# Patient Record
Sex: Female | Born: 1960 | Race: Black or African American | Hispanic: No | Marital: Single | Smoking: Current every day smoker
Health system: Southern US, Community
[De-identification: ages and names within clinical notes are randomized; demographics above are authoritative.]

## PROBLEM LIST (undated history)

## (undated) HISTORY — PX: ABDOMINAL HYSTERECTOMY: SHX81

## (undated) HISTORY — PX: HERNIA REPAIR: SHX51

---

## 2013-12-04 ENCOUNTER — Emergency Department (HOSPITAL_BASED_OUTPATIENT_CLINIC_OR_DEPARTMENT_OTHER): Payer: Self-pay

## 2013-12-04 ENCOUNTER — Emergency Department (HOSPITAL_BASED_OUTPATIENT_CLINIC_OR_DEPARTMENT_OTHER)
Admission: EM | Admit: 2013-12-04 | Discharge: 2013-12-04 | Disposition: A | Discharge status: Home / Self Care | Payer: Self-pay | Attending: Emergency Medicine | Admitting: Emergency Medicine

## 2013-12-04 ENCOUNTER — Encounter (HOSPITAL_BASED_OUTPATIENT_CLINIC_OR_DEPARTMENT_OTHER): Payer: Self-pay | Admitting: Emergency Medicine

## 2013-12-04 DIAGNOSIS — R109 Unspecified abdominal pain: Secondary | ICD-10-CM

## 2013-12-04 DIAGNOSIS — Z9049 Acquired absence of other specified parts of digestive tract: Secondary | ICD-10-CM | POA: Insufficient documentation

## 2013-12-04 DIAGNOSIS — R1084 Generalized abdominal pain: Secondary | ICD-10-CM | POA: Insufficient documentation

## 2013-12-04 DIAGNOSIS — Z720 Tobacco use: Secondary | ICD-10-CM | POA: Insufficient documentation

## 2013-12-04 DIAGNOSIS — Z9889 Other specified postprocedural states: Secondary | ICD-10-CM | POA: Insufficient documentation

## 2013-12-04 LAB — COMPREHENSIVE METABOLIC PANEL
ALT: 8 U/L (ref 0–35)
AST: 11 U/L (ref 0–37)
Albumin: 3.9 g/dL (ref 3.5–5.2)
Alkaline Phosphatase: 87 U/L (ref 39–117)
Anion gap: 13 (ref 5–15)
BUN: 9 mg/dL (ref 6–23)
CALCIUM: 9.5 mg/dL (ref 8.4–10.5)
CO2: 27 mEq/L (ref 19–32)
Chloride: 104 mEq/L (ref 96–112)
Creatinine, Ser: 0.8 mg/dL (ref 0.50–1.10)
GFR calc non Af Amer: 83 mL/min — ABNORMAL LOW (ref >90)
Glucose, Bld: 99 mg/dL (ref 70–99)
Potassium: 3.8 mEq/L (ref 3.7–5.3)
Sodium: 144 mEq/L (ref 137–147)
TOTAL PROTEIN: 7.2 g/dL (ref 6.0–8.3)
Total Bilirubin: 0.3 mg/dL (ref 0.3–1.2)

## 2013-12-04 LAB — URINE MICROSCOPIC-ADD ON

## 2013-12-04 LAB — URINALYSIS, ROUTINE W REFLEX MICROSCOPIC
GLUCOSE, UA: NEGATIVE mg/dL
Hgb urine dipstick: NEGATIVE
Ketones, ur: 15 mg/dL — AB
Nitrite: NEGATIVE
PH: 5.5 (ref 5.0–8.0)
Protein, ur: NEGATIVE mg/dL
SPECIFIC GRAVITY, URINE: 1.029 (ref 1.005–1.030)
Urobilinogen, UA: 1 mg/dL (ref 0.0–1.0)

## 2013-12-04 LAB — LIPASE, BLOOD: Lipase: 26 U/L (ref 11–59)

## 2013-12-04 LAB — CBC WITH DIFFERENTIAL/PLATELET
Basophils Absolute: 0 10*3/uL (ref 0.0–0.1)
Basophils Relative: 0 % (ref 0–1)
Eosinophils Absolute: 0.2 10*3/uL (ref 0.0–0.7)
Eosinophils Relative: 2 % (ref 0–5)
HCT: 40.5 % (ref 36.0–46.0)
HEMOGLOBIN: 13.4 g/dL (ref 12.0–15.0)
LYMPHS ABS: 3.5 10*3/uL (ref 0.7–4.0)
Lymphocytes Relative: 45 % (ref 12–46)
MCH: 27.5 pg (ref 26.0–34.0)
MCHC: 33.1 g/dL (ref 30.0–36.0)
MCV: 83.2 fL (ref 78.0–100.0)
MONOS PCT: 8 % (ref 3–12)
Monocytes Absolute: 0.6 10*3/uL (ref 0.1–1.0)
NEUTROS PCT: 45 % (ref 43–77)
Neutro Abs: 3.6 10*3/uL (ref 1.7–7.7)
Platelets: 259 10*3/uL (ref 150–400)
RBC: 4.87 MIL/uL (ref 3.87–5.11)
RDW: 13.4 % (ref 11.5–15.5)
WBC: 7.9 10*3/uL (ref 4.0–10.5)

## 2013-12-04 MED ORDER — PANTOPRAZOLE SODIUM 40 MG IV SOLR
40.0000 mg | Freq: Once | INTRAVENOUS | Status: AC
  Administered 2013-12-04: 40 mg via INTRAVENOUS
  Filled 2013-12-04: qty 40

## 2013-12-04 MED ORDER — ONDANSETRON HCL 4 MG/2ML IJ SOLN
4.0000 mg | Freq: Once | INTRAMUSCULAR | Status: AC
  Administered 2013-12-04: 4 mg via INTRAVENOUS
  Filled 2013-12-04: qty 2

## 2013-12-04 MED ORDER — IOHEXOL 300 MG/ML  SOLN
100.0000 mL | Freq: Once | INTRAMUSCULAR | Status: AC | PRN
  Administered 2013-12-04: 100 mL via INTRAVENOUS

## 2013-12-04 MED ORDER — IOHEXOL 300 MG/ML  SOLN
25.0000 mL | Freq: Once | INTRAMUSCULAR | Status: AC | PRN
  Administered 2013-12-04: 25 mL via ORAL

## 2013-12-04 MED ORDER — MORPHINE SULFATE 4 MG/ML IJ SOLN
4.0000 mg | Freq: Once | INTRAMUSCULAR | Status: AC
  Administered 2013-12-04: 4 mg via INTRAVENOUS
  Filled 2013-12-04: qty 1

## 2013-12-04 MED ORDER — OMEPRAZOLE 20 MG PO CPDR: 20.0000 mg | DELAYED_RELEASE_CAPSULE | Freq: Two times a day (BID) | ORAL | Status: AC

## 2013-12-04 MED ORDER — ONDANSETRON 4 MG PO TBDP: 4.0000 mg | ORAL_TABLET | Freq: Three times a day (TID) | ORAL | Status: AC | PRN

## 2013-12-04 MED ORDER — HYDROCODONE-ACETAMINOPHEN 5-325 MG PO TABS: 1.0000 | ORAL_TABLET | ORAL | Status: AC | PRN

## 2013-12-04 NOTE — ED Provider Notes (Signed)
CSN: [id number]     Arrival date & time 12/04/13  1348 History   First MD Initiated Contact with Patient 12/04/13 1409     Chief Complaint  Patient presents with  . Abdominal Pain     Patient is a 53 y.o. female presenting with abdominal pain. The history is provided by the patient.  Abdominal Pain Pain location:  Generalized Pain quality: aching   Pain radiates to:  Does not radiate Pain severity:  Moderate Onset quality:  Gradual Duration:  4 days Timing:  Constant Progression:  Worsening Chronicity:  New Relieved by:  Nothing Worsened by:  Palpation and eating Associated symptoms: no dysuria   pt reports for past 4 days she has had diffuse abdominal pain She reports she has pain with eating and also nausea/vomiting and reports recent 15lb wt loss.     She reports episode of diarrhea about 3 weeks ago that lasted for a week.  This resolved, and she then developed a UTI and that was treated at an outside hospital and she has completed that treatment.      PMH - none  Past Surgical History  Procedure Laterality Date  . Abdominal hysterectomy    . Hernia repair     No family history on file. History  Substance Use Topics  . Smoking status: Current Every Day Smoker  . Smokeless tobacco: Not on file  . Alcohol Use: No   OB History   Grav Para Term Preterm Abortions TAB SAB Ect Mult Living                 Review of Systems  Constitutional: Positive for unexpected weight change.  Gastrointestinal: Positive for abdominal pain.  Genitourinary: Negative for dysuria.  All other systems reviewed and are negative.     Allergies  Review of patient's allergies indicates no known allergies.  Home Medications   Prior to Admission medications   Not on File   BP 127/83  Pulse 93  Temp(Src) 98.1 F (36.7 C) (Oral)  Resp 18  SpO2 97% Physical Exam CONSTITUTIONAL: Well developed/well nourished HEAD: Normocephalic/atraumatic EYES: EOMI/PERRL, no icterus ENMT:  Mucous membranes moist NECK: supple no meningeal signs SPINE:entire spine nontender CV: S1/S2 noted, no murmurs/rubs/gallops noted LUNGS: Lungs are clear to auscultation bilaterally, no apparent distress ABDOMEN: soft, diffuse mild tenderness, moderate RLQ tenderness noted, no rebound or guarding GU:no cva tenderness NEURO: Pt is awake/alert, moves all extremitiesx4 EXTREMITIES: pulses normal, full ROM SKIN: warm, color normal PSYCH: no abnormalities of mood noted  ED Course  Procedures   3:34 PM Plan at signout to dr Fayrene Fearingjames f/u on CT imaging  Labs Review Labs Reviewed  CBC WITH DIFFERENTIAL  COMPREHENSIVE METABOLIC PANEL  LIPASE, BLOOD  URINALYSIS, ROUTINE W REFLEX MICROSCOPIC    Date: 12/04/2013  Rate: 71  Rhythm: normal sinus rhythm  QRS Axis: left  Intervals: normal  ST/T Wave abnormalities: nonspecific ST changes  Conduction Disutrbances:none     MDM   Final diagnoses:  Abdominal pain    Nursing notes including past medical history and social history reviewed and considered in documentation Labs/vital reviewed and considered     Joya Gaskinsonald W Ryah Cribb, MD 12/04/13 1535

## 2013-12-04 NOTE — Discharge Instructions (Signed)
Liquid diet today. Slowly advance your diet as your symptoms improve. Recheck with any additional symptoms.  Abdominal Pain Many things can cause abdominal pain. Usually, abdominal pain is not caused by a disease and will improve without treatment. It can often be observed and treated at home. Your health care provider will do a physical exam and possibly order blood tests and X-rays to help determine the seriousness of your pain. However, in many cases, more time must pass before a clear cause of the pain can be found. Before that point, your health care provider may not know if you need more testing or further treatment. HOME CARE INSTRUCTIONS  Monitor your abdominal pain for any changes. The following actions may help to alleviate any discomfort you are experiencing:  Only take over-the-counter or prescription medicines as directed by your health care provider.  Do not take laxatives unless directed to do so by your health care provider.  Try a clear liquid diet (broth, tea, or water) as directed by your health care provider. Slowly move to a bland diet as tolerated. SEEK MEDICAL CARE IF:  You have unexplained abdominal pain.  You have abdominal pain associated with nausea or diarrhea.  You have pain when you urinate or have a bowel movement.  You experience abdominal pain that wakes you in the night.  You have abdominal pain that is worsened or improved by eating food.  You have abdominal pain that is worsened with eating fatty foods.  You have a fever. SEEK IMMEDIATE MEDICAL CARE IF:   Your pain does not go away within 2 hours.  You keep throwing up (vomiting).  Your pain is felt only in portions of the abdomen, such as the right side or the left lower portion of the abdomen.  You pass bloody or black tarry stools. MAKE SURE YOU:  Understand these instructions.   Will watch your condition.   Will get help right away if you are not doing well or get worse.   Document Released: 11/01/2004 Document Revised: 01/27/2013 Document Reviewed: 10/01/2012 [facility] Patient Information 2015 [place], [place]. This information is not intended to replace advice given to you by your health care provider. Make sure you discuss any questions you have with your health care provider.

## 2013-12-04 NOTE — ED Notes (Signed)
Pt reports 3 weeks of abdominal pain and nausea as well as diarrhea and UTI

## 2013-12-04 NOTE — ED Provider Notes (Signed)
Patient seen and evaluated.  Care received from Dr. Bebe ShaggyWickline. CT results reviewed with patient. On reexam she has no focal tenderness. Her biggest complaint to me is that when she eats she feels full and uncomfortable. Does not have any biliary symptoms or colic. She is appropriate for outpatient follow-up. Clear liquids, advancing her diet.   Rolland PorterMark Zavien Clubb, MD 12/04/13 [phone]

## 2013-12-17 ENCOUNTER — Encounter (HOSPITAL_BASED_OUTPATIENT_CLINIC_OR_DEPARTMENT_OTHER): Payer: Self-pay | Admitting: *Deleted

## 2013-12-17 ENCOUNTER — Emergency Department (HOSPITAL_BASED_OUTPATIENT_CLINIC_OR_DEPARTMENT_OTHER)
Admission: EM | Admit: 2013-12-17 | Discharge: 2013-12-17 | Disposition: A | Discharge status: Home / Self Care | Payer: Self-pay | Attending: Emergency Medicine | Admitting: Emergency Medicine

## 2013-12-17 DIAGNOSIS — R197 Diarrhea, unspecified: Secondary | ICD-10-CM | POA: Insufficient documentation

## 2013-12-17 DIAGNOSIS — Z79899 Other long term (current) drug therapy: Secondary | ICD-10-CM | POA: Insufficient documentation

## 2013-12-17 DIAGNOSIS — Z720 Tobacco use: Secondary | ICD-10-CM | POA: Insufficient documentation

## 2013-12-17 LAB — CBC WITH DIFFERENTIAL/PLATELET
Basophils Absolute: 0 10*3/uL (ref 0.0–0.1)
Basophils Relative: 0 % (ref 0–1)
EOS PCT: 2 % (ref 0–5)
Eosinophils Absolute: 0.2 10*3/uL (ref 0.0–0.7)
HEMATOCRIT: 40.3 % (ref 36.0–46.0)
Hemoglobin: 13.2 g/dL (ref 12.0–15.0)
LYMPHS ABS: 3.1 10*3/uL (ref 0.7–4.0)
Lymphocytes Relative: 35 % (ref 12–46)
MCH: 27.4 pg (ref 26.0–34.0)
MCHC: 32.8 g/dL (ref 30.0–36.0)
MCV: 83.8 fL (ref 78.0–100.0)
MONO ABS: 0.4 10*3/uL (ref 0.1–1.0)
Monocytes Relative: 5 % (ref 3–12)
Neutro Abs: 5.1 10*3/uL (ref 1.7–7.7)
Neutrophils Relative %: 58 % (ref 43–77)
Platelets: 231 10*3/uL (ref 150–400)
RBC: 4.81 MIL/uL (ref 3.87–5.11)
RDW: 13.3 % (ref 11.5–15.5)
WBC: 8.9 10*3/uL (ref 4.0–10.5)

## 2013-12-17 LAB — COMPREHENSIVE METABOLIC PANEL
ALK PHOS: 92 U/L (ref 39–117)
ALT: 11 U/L (ref 0–35)
AST: 16 U/L (ref 0–37)
Albumin: 4 g/dL (ref 3.5–5.2)
Anion gap: 12 (ref 5–15)
BUN: 8 mg/dL (ref 6–23)
CHLORIDE: 106 meq/L (ref 96–112)
CO2: 27 meq/L (ref 19–32)
Calcium: 9.4 mg/dL (ref 8.4–10.5)
Creatinine, Ser: 0.8 mg/dL (ref 0.50–1.10)
GFR, EST NON AFRICAN AMERICAN: 83 mL/min — AB (ref >90)
GLUCOSE: 92 mg/dL (ref 70–99)
Potassium: 4.1 mEq/L (ref 3.7–5.3)
SODIUM: 145 meq/L (ref 137–147)
Total Bilirubin: 0.3 mg/dL (ref 0.3–1.2)
Total Protein: 7.4 g/dL (ref 6.0–8.3)

## 2013-12-17 LAB — URINE MICROSCOPIC-ADD ON

## 2013-12-17 LAB — URINALYSIS, ROUTINE W REFLEX MICROSCOPIC
BILIRUBIN URINE: NEGATIVE
Glucose, UA: NEGATIVE mg/dL
Hgb urine dipstick: NEGATIVE
KETONES UR: NEGATIVE mg/dL
NITRITE: NEGATIVE
PROTEIN: NEGATIVE mg/dL
SPECIFIC GRAVITY, URINE: 1.007 (ref 1.005–1.030)
UROBILINOGEN UA: 0.2 mg/dL (ref 0.0–1.0)
pH: 7 (ref 5.0–8.0)

## 2013-12-17 LAB — CLOSTRIDIUM DIFFICILE BY PCR: Toxigenic C. Difficile by PCR: NEGATIVE

## 2013-12-17 LAB — OCCULT BLOOD X 1 CARD TO LAB, STOOL: Fecal Occult Bld: NEGATIVE

## 2013-12-17 LAB — LIPASE, BLOOD: Lipase: 22 U/L (ref 11–59)

## 2013-12-17 NOTE — ED Provider Notes (Signed)
CSN: [id number]     Arrival date & time 12/17/13  [id number] History   First MD Initiated Contact with Patient 12/17/13 1151     Chief Complaint  Patient presents with  . Diarrhea     (Consider location/radiation/quality/duration/timing/severity/associated sxs/prior Treatment) HPI  5 weeks of diarrhea, like water starting yesterday. Previously "runny". Crampy, pressure like abdominal pain. Increases before BM, some relief after. No fever. Occasional chills. 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 Nausea with no vomiting. 8lbs weight loss. If eats, straight to bathroom. Does not drink city water, drinks bottled. No travel history. Antibiotics 3 weeks ago for UTI, doesn;t recall name. Started after diarrhea onset.  History reviewed. No pertinent past medical history. Past Surgical History  Procedure Laterality Date  . Abdominal hysterectomy    . Hernia repair     No family history on file. History  Substance Use Topics  . Smoking status: Current Every Day Smoker  . Smokeless tobacco: Not on file  . Alcohol Use: No   OB History    No data available     Review of Systems 10 Systems reviewed and are negative for acute change except as noted in the HPI.   Allergies  Review of patient's allergies indicates no known allergies.  Home Medications   Prior to Admission medications   Medication Sig Start Date End Date Taking? Authorizing Provider  HYDROcodone-acetaminophen (NORCO/VICODIN) 5-325 MG per tablet Take 1 tablet by mouth every 4 (four) hours as needed. 12/04/13   Rolland PorterMark James, MD  omeprazole (PRILOSEC) 20 MG capsule Take 1 capsule (20 mg total) by mouth 2 (two) times daily. 12/04/13   Rolland PorterMark James, MD  ondansetron (ZOFRAN ODT) 4 MG disintegrating tablet Take 1 tablet (4 mg total) by mouth every 8 (eight) hours as needed for nausea. 12/04/13   Rolland PorterMark James, MD   BP 141/88 mmHg  Pulse 65  Temp(Src) 97.9 F (36.6 C) (Oral)  Resp 18  Ht 5'  5" (1.651 m)  Wt 176 lb (79.833 kg)  BMI 29.29 kg/m2  SpO2 100% Physical Exam  Constitutional: She is oriented to person, place, and time. She appears well-developed and well-nourished.  HENT:  Head: Normocephalic and atraumatic.  Eyes: EOM are normal. Pupils are equal, round, and reactive to light.  Neck: Neck supple.  Cardiovascular: Normal rate, regular rhythm, normal heart sounds and intact distal pulses.   Pulmonary/Chest: Effort normal and breath sounds normal.  Abdominal: Soft. Bowel sounds are normal. She exhibits no distension. There is no tenderness.  Musculoskeletal: Normal range of motion. She exhibits no edema.  Neurological: She is alert and oriented to person, place, and time. She has normal strength. Coordination normal. GCS eye subscore is 4. GCS verbal subscore is 5. GCS motor subscore is 6.  Skin: Skin is warm, dry and intact.  Psychiatric: She has a normal mood and affect.    ED Course  Procedures (including critical care time) Labs Review Labs Reviewed  URINALYSIS, ROUTINE W REFLEX MICROSCOPIC - Abnormal; Notable for the following:    Leukocytes, UA TRACE (*)    All other components within normal limits  COMPREHENSIVE METABOLIC PANEL - Abnormal; Notable for the following:    GFR calc non Af Amer 83 (*)    All other components within normal limits  URINE MICROSCOPIC-ADD ON - Abnormal; Notable for the following:    Squamous Epithelial / LPF FEW (*)    Bacteria, UA FEW (*)  All other components within normal limits  STOOL CULTURE  CLOSTRIDIUM DIFFICILE BY PCR  LIPASE, BLOOD  CBC WITH DIFFERENTIAL  OCCULT BLOOD X 1 CARD TO LAB, STOOL    Imaging Review No results found.   EKG Interpretation None      MDM   Final diagnoses:  Diarrhea   At this point diagnostic workup is negative. Stool cultures will be pending. Patient's chemistries and blood counts are within limits. There is no evidence of ongoing UTI. Patient is counseled that with diarrhea  that at this point in time is chronic in nature, she will need probable GI referral for further investigation. She is advised to find a family physician from the referral guide. Patient is counseled that within several days for culture results should be done. At this point it does appear to be of lower probability that this will be of infectious nature do to the fact of no fever, no blood, no leukocytosis. I did not feel that empiric treatment would be appropriate until culture results are returned.    Arby BarretteMarcy Lurlene Ronda, MD 12/17/13 [phone]

## 2013-12-17 NOTE — ED Notes (Signed)
Pt sts she was seen here 2 weeks ago for diarrhea. She sts that she has not gotten any better and she is now having a HA.

## 2013-12-17 NOTE — ED Notes (Signed)
Reports loose BM x 5 weeks- has been on antibiotics for UTI 3 weeks ago- reports 8 lb weight loss since sx started- food triggers loose BM- BM watery today

## 2013-12-17 NOTE — Discharge Instructions (Signed)
Chronic Diarrhea Diarrhea is frequent loose and watery bowel movements. It can cause you to feel weak and dehydrated. Dehydration can cause you to become tired and thirsty and to have a dry mouth, decreased urination, and dark yellow urine. Diarrhea is a sign of another problem, most often an infection that will not last long. In most cases, diarrhea lasts 2-3 days. Diarrhea that lasts longer than 4 weeks is called long-lasting (chronic) diarrhea. It is important to treat your diarrhea as directed by your health care provider to lessen or prevent future episodes of diarrhea.  CAUSES  There are many causes of chronic diarrhea. The following are some possible causes:   Gastrointestinal infections caused by viruses, bacteria, or parasites.   Food poisoning or food allergies.   Certain medicines, such as antibiotics, chemotherapy, and laxatives.   Artificial sweeteners and fructose.   Digestive disorders, such as celiac disease and inflammatory bowel diseases.   Irritable bowel syndrome.  Some disorders of the pancreas.  Disorders of the thyroid.  Reduced blood flow to the intestines.  Cancer. Sometimes the cause of chronic diarrhea is unknown. RISK FACTORS  Having a severely weakened immune system, such as from HIV or AIDS.   Taking certain types of cancer-fighting drugs (such as with chemotherapy) or other medicines.   Having had a recent organ transplant.   Having a portion of the stomach or small bowel removed.   Traveling to countries where food and water supplies are often contaminated.  SYMPTOMS  In addition to frequent, loose stools, diarrhea may cause:   Cramping.   Abdominal pain.   Nausea.   Fever.  Fatigue.  Urgent need to use the bathroom.  Loss of bowel control. DIAGNOSIS  Your health care provider must take a careful history and perform a physical exam. Tests given are based on your symptoms and history. Tests may include:   Blood or  stool tests. Three or more stool samples may be examined. Stool cultures may be used to test for bacteria or parasites.   X-rays.   A procedure in which a thin tube is inserted into the mouth or rectum (endoscopy). This allows the health care provider to look inside the intestine.  TREATMENT   Treatment is aimed at correcting the cause of the diarrhea when possible.  Diarrhea caused by an infection can often be treated with antibiotic medicines.  Diarrhea not caused by an infection may require you to take long-term medicine or have surgery. Specific treatment should be discussed with your health care provider.  If the cause cannot be determined, treatment aims to relieve symptoms and prevent dehydration. Serious health problems can occur if you do not maintain proper fluid levels. Treatment may include:  Taking an oral rehydration solution (ORS).  Not drinking beverages that contain caffeine (such as tea, coffee, and soft drinks).  Not drinking alcohol.  Maintaining well-balanced nutrition to help you recover faster. HOME CARE INSTRUCTIONS   Drink enough fluids to keep urine clear or pale yellow. Drink 1 cup (8 oz) of fluid for each diarrhea episode. Avoid fluids that contain simple sugars, fruit juices, whole milk products, and sodas. Hydrate with an ORS. You may purchase the ORS or prepare it at home by mixing the following ingredients together:   - tsp (1.7-3  mL) table salt.   tsp (3  mL) baking soda.   tsp (1.7 mL) salt substitute containing potassium chloride.  1 tbsp (20 mL) sugar.  4.2 c (1 L) of water.  Certain foods and beverages may increase the speed at which food moves through the gastrointestinal (GI) tract. These foods and beverages should be avoided. They include:  Caffeinated and alcoholic beverages.  High-fiber foods, such as raw fruits and vegetables, nuts, seeds, and whole grain breads and cereals.  Foods and beverages sweetened with sugar  alcohols, such as xylitol, sorbitol, and mannitol.   Some foods may be well tolerated and may help thicken stool. These include:  Starchy foods, such as rice, toast, pasta, low-sugar cereal, oatmeal, grits, baked potatoes, crackers, and bagels.  Bananas.  Applesauce.  Add probiotic-rich foods to help increase healthy bacteria in the GI tract. These include yogurt and fermented milk products.  Wash your hands well after each diarrhea episode.  Only take over-the-counter or prescription medicines as directed by your health care provider.  Take a warm bath to relieve any burning or pain from frequent diarrhea episodes. SEEK MEDICAL CARE IF:   You are not urinating as often.  Your urine is a dark color.  You become very tired or dizzy.  You have severe pain in the abdomen or rectum.  Your have blood or pus in your stools.  Your stools look black and tarry. SEEK IMMEDIATE MEDICAL CARE IF:   You are unable to keep fluids down.  You have persistent vomiting.  You have blood in your stool.  Your stools are black and tarry.  You do not urinate in 6-8 hours, or there is only a small amount of very dark urine.  You have abdominal pain that increases or localizes.  You have weakness, dizziness, confusion, or lightheadedness.  You have a severe headache.  Your diarrhea gets worse or does not get better.  You have a fever or persistent symptoms for more than 2-3 days.  You have a fever and your symptoms suddenly get worse. MAKE SURE YOU:   Understand these instructions.  Will watch your condition.  Will get help right away if you are not doing well or get worse. Document Released: 04/14/2003 Document Revised: 01/27/2013 Document Reviewed: 07/17/2012 [facility] Patient Information 2015 [place], [place]. This information is not intended to replace advice given to you by your health care provider. Make sure you discuss any questions you have with your health care  provider.  Emergency Department Resource Guide 1) Find a Doctor and Pay Out of Pocket Although you won't have to find out who is covered by your insurance plan, it is a good idea to ask around and get recommendations. You will then need to call the office and see if the doctor you have chosen will accept you as a new patient and what types of options they offer for patients who are self-pay. Some doctors offer discounts or will set up payment plans for their patients who do not have insurance, but you will need to ask so you aren't surprised when you get to your appointment.  2) Contact Your Local Health Department Not all health departments have doctors that can see patients for sick visits, but many do, so it is worth a call to see if yours does. If you don't know where your local health department is, you can check in your phone book. The CDC also has a tool to help you locate your state's health department, and many state websites also have listings of all of their local health departments.  3) Find a Walk-in Clinic If your illness is not likely to be very severe or complicated, you may want to  try a walk in clinic. These are popping up all over the country in pharmacies, drugstores, and shopping centers. They're usually staffed by nurse practitioners or physician assistants that have been trained to treat common illnesses and complaints. They're usually fairly quick and inexpensive. However, if you have serious medical issues or chronic medical problems, these are probably not your best option.  No Primary Care Doctor: - Call Health Connect at  [phone] - they can help you locate a primary care doctor that  accepts your insurance, provides certain services, etc. - Physician Referral Service- [phone]  Chronic Pain Problems: Organization         Address  Phone   Notes  [facility]  [phone] Patients need to be referred by their primary care doctor.   Medication  Assistance: Organization         Address  Phone   Notes  [facility] Medication [facility] [address] [place], [place] [address] [phone] --Must be a resident of [facility] [place] -- Must have NO insurance coverage whatsoever (no Medicaid/ Medicare, etc.) -- The pt. MUST have a primary care doctor that directs their care regularly and follows them in the community   MedAssist  [phone]   [employer]  [phone]    Agencies that provide inexpensive medical care: Organization         Address  Phone   Notes  [facility]  [phone]   [facility]    [phone]   [facility] [address] [place], [place] [address] [phone]   [facility] [address], [place] [phone]   Planned Parenthood    [phone]   [facility]    [phone]   [facility]  [address], [place] Phone:  [phone], Fax:  [phone] Hours of Operation:  9 am - 6 pm, M-F.  Also accepts Medicaid/Medicare and self-pay.  [facility]  [address], [place] Phone: [phone], Fax: [phone]. Hours of Operation:  8:30 am - 5:30 pm, M-F.  Also accepts Medicaid and self-pay.  [facility] [place] [address], [place] Phone: [phone]   [facility] [address], [place], [place] [phone], Ext. [phone] Mondays & Thursdays: 7-9 AM.  First 15 patients are seen on a first come, first serve basis.    Medicaid-accepting [facility] Providers:  Organization         Address  Phone   Notes  [facility] [address], [place] [phone] Also accepts self-pay patients.  [facility] [address], [place]  [phone]   [facility] [address], [place]  [phone]) [phone]   [facility] [address] [place] [address], [place] [phone]) [phone]   Renaye RakersVeita Bland [address], [place]   [phone] Only accepts [place] Access [place] patients after they have their name applied to their card.   Self-Pay (no insurance) in [facility] [place]:  Organization         Address  Phone   Notes  Sickle Cell Patients, [facility] [address], [place] [phone]   [facility] [address], [place] [phone]  [facility] [place]  [address], [place] [phone]   [facility]/Dr. Osei-Bonsu  [address] [place] or [address], [place] [phone] Phone number for both [place] and [place] locations is the same.  [facility] [address], [place] [phone]   [facility] [address] [place] or [address] [phone]   [facility] [address], [place] [phone], phone; [phone], fax Sees patients 1st and 3rd Saturday of every month.  Must not qualify for public or private insurance (i.e. Medicaid, Medicare, [place] Health Choice, Veterans' Benefits)  Household income should be no more than 200% of the poverty level The clinic cannot treat you if you are pregnant or think you are pregnant  Sexually transmitted diseases are not treated at the clinic.    Dental Care: Organization         Address  Phone  Notes  [facility] [phone] Accepts children up to age 84 who are enrolled in [place] or [place]; pregnant women with a Medicaid card; and children who have applied for Medicaid or [place] Health Choice, but were declined, whose parents can pay a reduced fee at time of service.  [facility]  [address], [place] [phone] Accepts children up to age 35 who are enrolled in [place] or [place]; pregnant women with a Medicaid card; and children who have applied for Medicaid or [place] Health Choice, but were declined, whose parents can pay a reduced fee at time of service.  [place] Adult Dental Access PROGRAM  [place] [phone] Patients are seen by appointment only. Walk-ins are not accepted. [place] will see patients 30 years of age and older. Monday - Tuesday (8am-5pm) Most Wednesdays (8:30-5pm) $30 per visit, cash only  [facility] Adult Dental Access PROGRAM  [address], [facility] [phone] Patients are seen by appointment only. Walk-ins are not accepted. [place] will see patients 87 years of age and older. One Wednesday Evening (Monthly: Volunteer Based).  $30 per visit, cash only  [place]  [phone] for adults; Children under age 49, call Graduate Pediatric Dentistry at [phone]. Children aged 49-14, please call [phone] to request a pediatric application.  Dental services are provided in all areas of dental care including fillings, crowns and bridges, complete and partial dentures, implants, gum treatment, root canals, and extractions. Preventive care is also provided. Treatment is provided to both adults and children. Patients are selected via a lottery and there is often a waiting list.   [facility] [address], [place]  [phone] [email or web address]   [facility] [address] [place], [place] [phone], Ext. [phone] Second and Fourth Thursday of each month, opens at 6:30 AM; Clinic ends at 9 AM.  Patients are seen on a first-come first-served basis, and a limited number are seen during each clinic.   [facility]  [address] Hillard Danker [place], [place] [phone]    Eligibility Requirements You must have lived in [place], [place], or [place] counties for at least the last three months.   You cannot be eligible for state or federal sponsored  healthcare insurance, including [employer], [place], or [employer].   You generally cannot be eligible for healthcare insurance through your employer.    How to apply: Eligibility screenings are held every Tuesday and Wednesday afternoon from 1:00 pm until 4:00 pm. You do not need an appointment for the interview!  [facility] [address], [place], [place]   [place]  [place] Department  [place]  [phone]    Behavioral Health Resources in the Community: Intensive Outpatient Programs Organization         Address  Phone  Notes  [place]. [address] [place], [place] [phone]   [facility] [address], [place], [place]   ADS: Alcohol & Drug Svcs [address], [place], [place]   [place] [address]  [place], [place] or [phone]   Substance Abuse Resources Organization         Address  Phone  Notes  Alcohol and Drug Services  [phone]   [place]  [phone]   [facility] [place]   Chinita Pester  [phone]   Residential & Outpatient Substance Abuse Program  [phone]   Psychological Services Organization         Address  Phone  Notes  [facility]  [place]  [phone]   [facility] [address], [place] or [phone]    Mobile Crisis Teams Organization         Address  Phone  Notes  [facility], Mobile Crisis Care Unit  [phone]   [facility]  [address]  [place], [place]   [place] Levels [address], [place] [phone]    Self-Help/Support Groups Organization         Address  Phone             Notes  [place]. of [place] - variety of support groups  [place] Call for more information  Narcotics Anonymous (NA), [facility]aring [facility] [address], [employer]   2 meetings at this location   Special educational needs teacher         Address  Phone  Notes  ASAP Residential Treatment [place],    [place]  [phone]   [facility]  [address], [place] [address], [place], [place]   [place], [place] [phone] Admissions: 8am-3pm M-F  Incentives Substance [place] [address],    [place], [place] [phone]   [facility] [address] [place], [place], [place]   [facility] [address],  [place], [place]   [facility] Programs - Intensive Outpatient [address], [place], [place]   [facility] [address]  [place], [place] [phone] or [phone]   [facility] Services (RTS) [address] [place], [place] Accepts Medicaid  [facility] [address],  [place] [phone] Substance Abuse/Addiction Treatment   [facility] Organization         Address  Phone  Notes  [facility]  [phone]   Domenic Schwab, PhD [address] [place], [place]   [phone] or [phone]   [facility]   [address] [place], [place] [phone]   [facility] [address] [place], [place] [phone] Insurance/Medicaid/sponsorship through [facility] and Families [address]                                    [place], [place] [phone] Therapy/tele-psych/case    [facility] [address]   [place], [place] [phone]    Dr. Lolly Mustache  [phone]   [facility]  United Way [facility] Dept. 1) [address], [place] 2) [address] [place] 3)  [address], [place] [phone]  [phone]   [facility] Child Abuse Hotline [phone]) 276-3180 or [phone] (After Hours)

## 2013-12-17 NOTE — ED Notes (Signed)
D/c home with family

## 2013-12-21 LAB — STOOL CULTURE: Special Requests: NORMAL

## 2016-09-18 IMAGING — CT CT ABD-PELV W/ CM
2 of 5 series · 17 of 46 positions shown, 19 images · IV contrast (APPLIED)
Comparison: None.

CLINICAL DATA: Periumbilical of pain. Nausea. Hernia repair.
Hysterectomy.

EXAM:
CT ABDOMEN AND PELVIS WITH CONTRAST
TECHNIQUE: Multidetector CT imaging of the abdomen and pelvis was performed
using the standard protocol following bolus administration of
intravenous contrast.
CONTRAST:  25mL OMNIPAQUE IOHEXOL 300 MG/ML SOLN, 100mL OMNIPAQUE
IOHEXOL 300 MG/ML SOLN

[Series 2: abd/pelvis 5.0 b31f · axial · 0.74mm/px · z∈[-432,-57]mm · 14 of 85 slices shown, 16 images]
[im 5/85  soft-tissue]
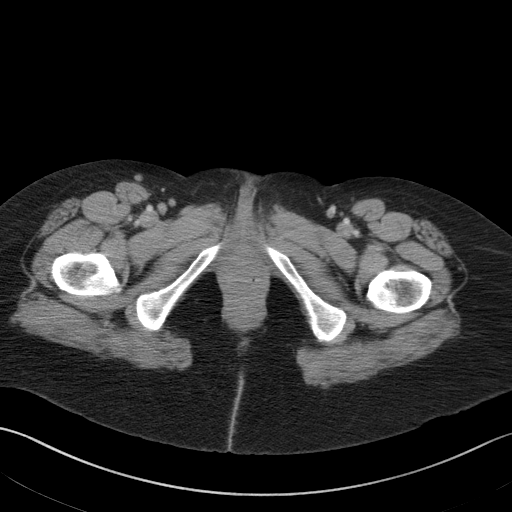
[im 5/85  bone]
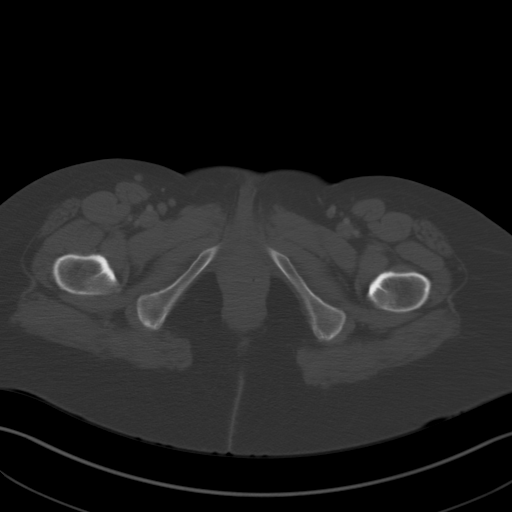
[im 10/85  soft-tissue]
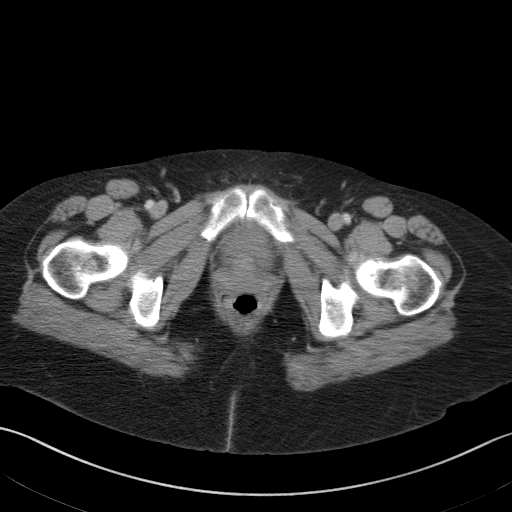
[im 19/85  soft-tissue]
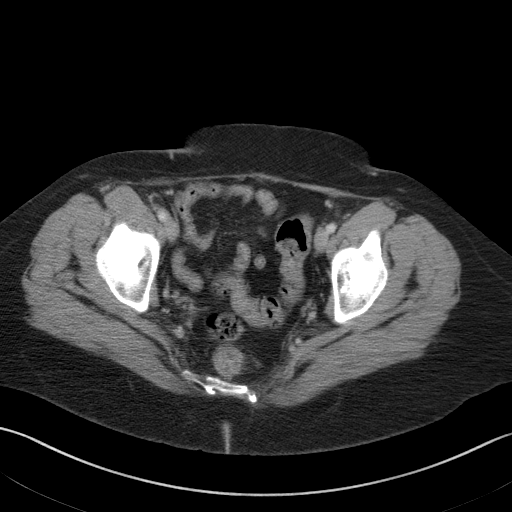
[im 24/85  soft-tissue]
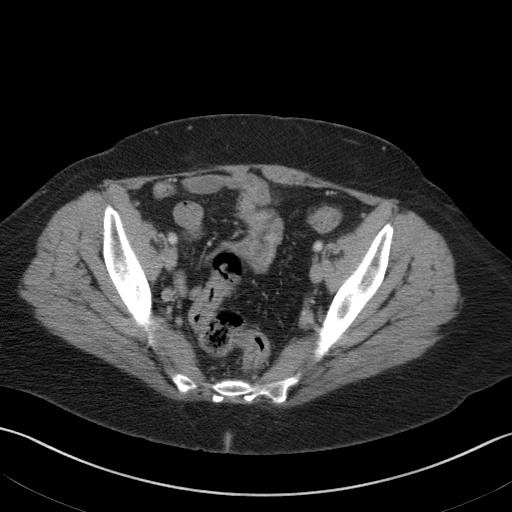
[im 29/85  soft-tissue]
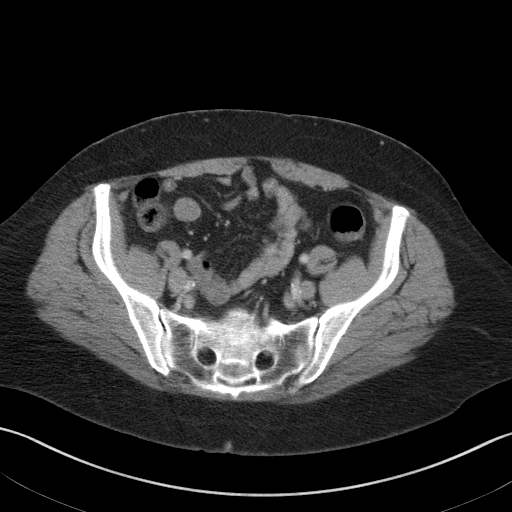
[im 33/85  soft-tissue]
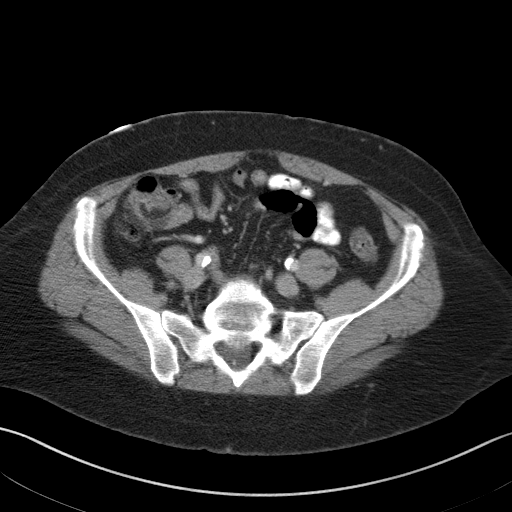
[im 38/85  soft-tissue]
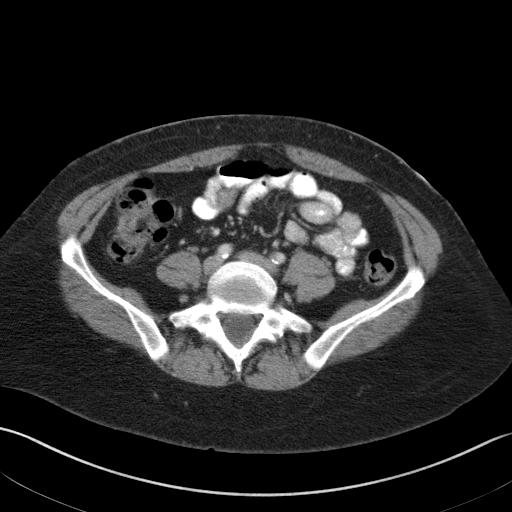
[im 47/85  soft-tissue]
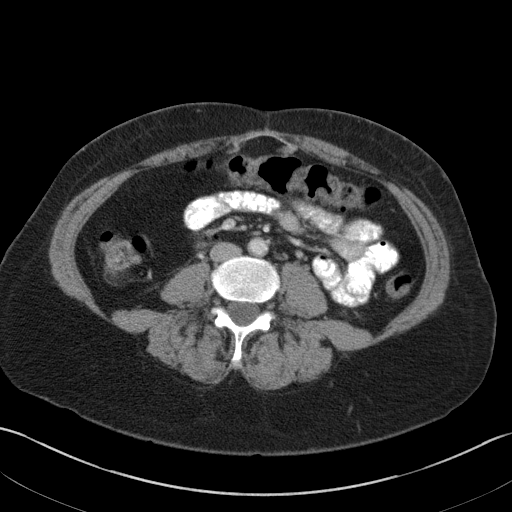
[im 52/85  soft-tissue]
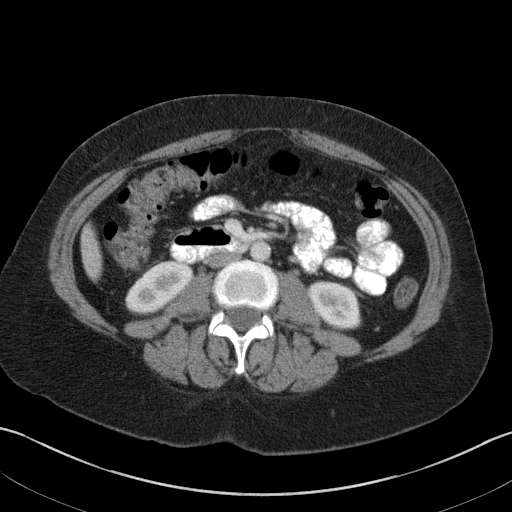
[im 52/85  bone]
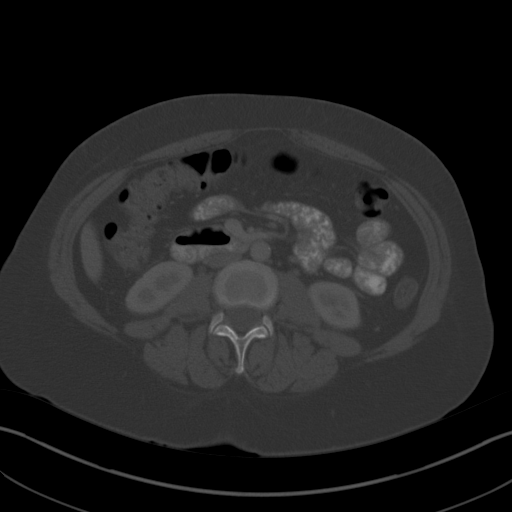
[im 57/85  soft-tissue]
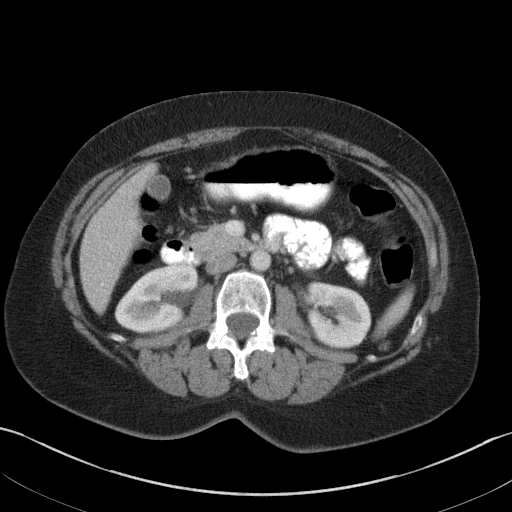
[im 61/85  soft-tissue]
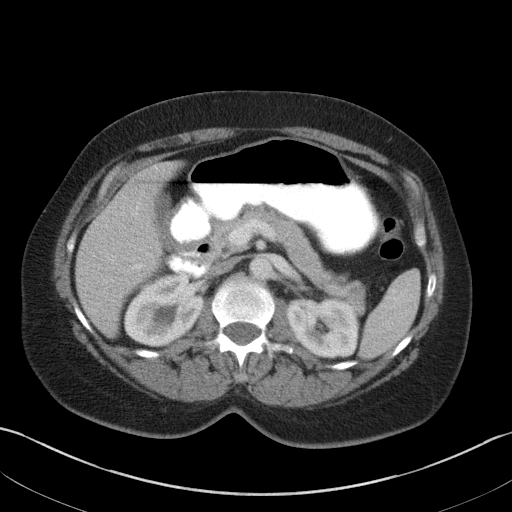
[im 66/85  soft-tissue]
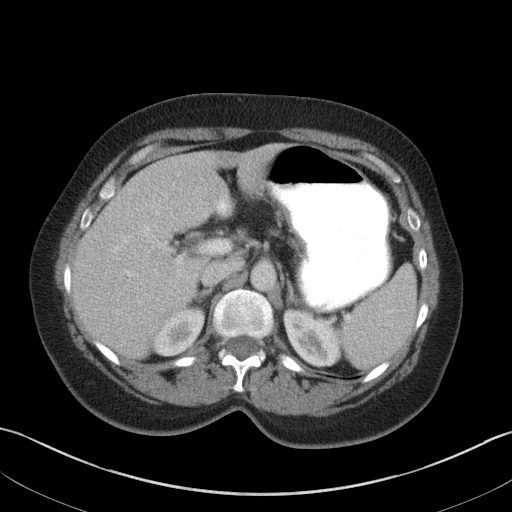
[im 75/85  soft-tissue]
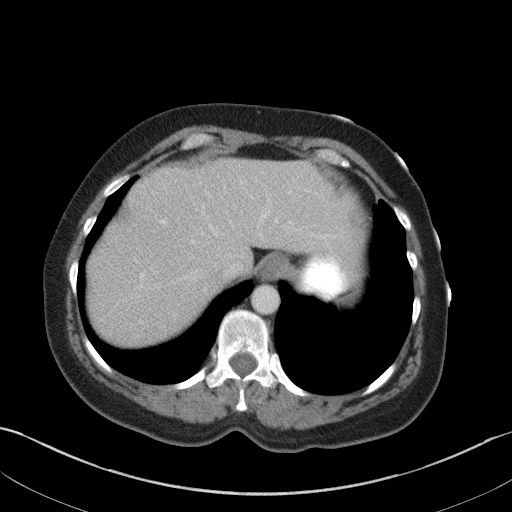
[im 80/85  soft-tissue]
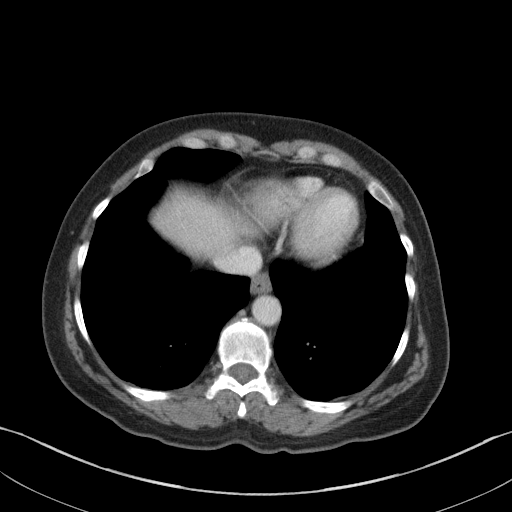

[Series 5: abd/pelvis 3.0 coronal · coronal · 0.76mm/px · 3 of 79 slices shown]
[im 27/79  soft-tissue]
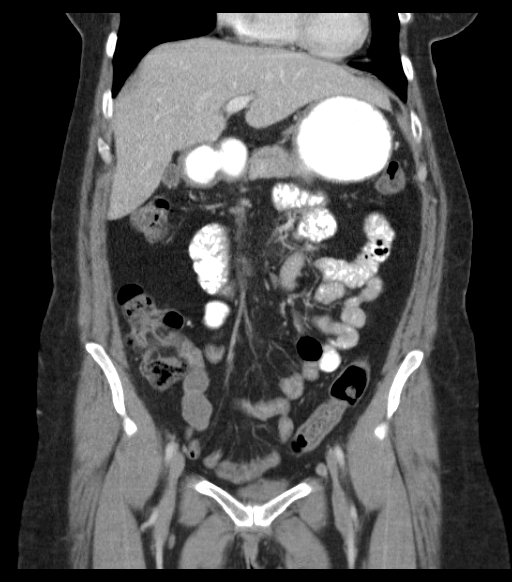
[im 35/79  soft-tissue]
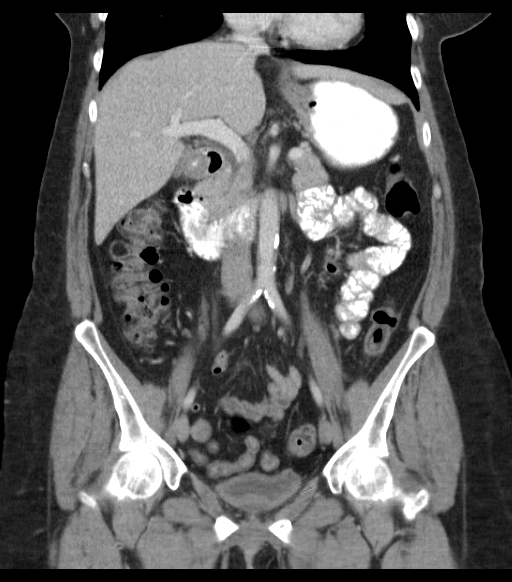
[im 44/79  soft-tissue]
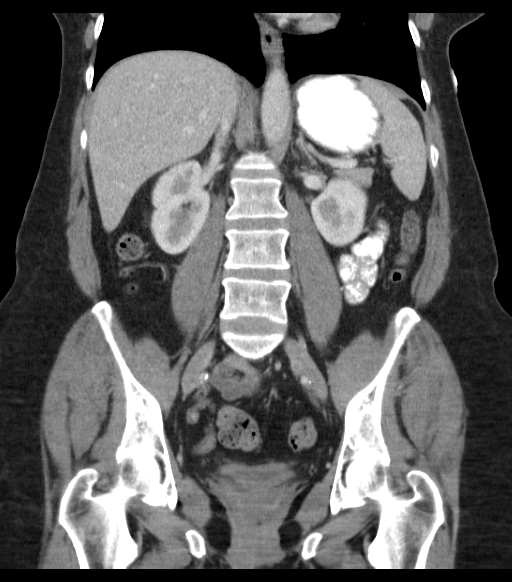

[17 of 46 positions shown; findings below may reference images not displayed]

FINDINGS: Liver, gallbladder, spleen, pancreas, adrenal glands, and kidneys
are within normal limits.

Normal appendix.  Normal terminal ileum.  Normal sigmoid colon.

Bladder is decompressed and within normal limits. Uterus is absent.
Adnexa are unremarkable.

No vertebral compression deformity.

No abnormal retroperitoneal adenopathy.

Hernia repair mesh at the anterior abdomen at the midline.

No free-fluid

Mild atherosclerotic calcifications of the infrarenal aorta and
iliac arteries.
IMPRESSION: No acute intra-abdominal pathology.
